# Patient Record
Sex: Male | Born: 1990 | Race: White | Hispanic: No | Marital: Single | Smoking: Current every day smoker
Health system: Southern US, Community
[De-identification: ages and names within clinical notes are randomized; demographics above are authoritative.]

## PROBLEM LIST (undated history)

## (undated) DIAGNOSIS — F419 Anxiety disorder, unspecified: Secondary | ICD-10-CM

## (undated) DIAGNOSIS — S069X9A Unspecified intracranial injury with loss of consciousness of unspecified duration, initial encounter: Secondary | ICD-10-CM

## (undated) HISTORY — PX: NO PAST SURGERIES: SHX2092

---

## 2011-06-23 ENCOUNTER — Emergency Department
Admission: EM | Admit: 2011-06-23 | Discharge: 2011-06-23 | Disposition: A | Discharge status: Home / Self Care | Payer: BC Managed Care – PPO | Source: Home / Self Care

## 2011-06-23 ENCOUNTER — Encounter: Payer: Self-pay | Admitting: *Deleted

## 2011-06-23 DIAGNOSIS — J069 Acute upper respiratory infection, unspecified: Secondary | ICD-10-CM

## 2011-06-23 DIAGNOSIS — J029 Acute pharyngitis, unspecified: Secondary | ICD-10-CM

## 2011-06-23 MED ORDER — AMOXICILLIN 875 MG PO TABS: 875.0000 mg | ORAL_TABLET | Freq: Two times a day (BID) | ORAL | Status: AC

## 2011-06-23 NOTE — ED Notes (Signed)
Pt c/o sore throat, nasal congestion and fever x 4 days. He has taken aleve, advil, dayquil and nyquil.

## 2011-06-23 NOTE — ED Provider Notes (Signed)
History     CSN: [id number]  Arrival date & time 06/23/11  1354   None     No chief complaint on file.   (Consider location/radiation/quality/duration/timing/severity/associated sxs/prior treatment) HPI Patrick Kim is a 21 y.o. male who complains of onset of cold symptoms for 7 days. He was getting worse until yesterday, but currently feeling a little better. + sore throat No cough No pleuritic pain No wheezing + nasal congestion + post-nasal drainage No sinus pain/pressure No chest congestion No itchy/red eyes No earache No hemoptysis No SOB + chills/sweats + fever No nausea No vomiting No abdominal pain No diarrhea No skin rashes + fatigue No myalgias No headache    No past medical history on file.  No past surgical history on file.  No family history on file.  History  Substance Use Topics  . Smoking status: Not on file  . Smokeless tobacco: Not on file  . Alcohol Use: Not on file      Review of Systems  Allergies  Review of patient's allergies indicates not on file.  Home Medications  No current outpatient prescriptions on file.  There were no vitals taken for this visit.  Physical Exam  Nursing note and vitals reviewed. Constitutional: He is oriented to person, place, and time. He appears well-developed and well-nourished.  HENT:  Head: Normocephalic and atraumatic.  Right Ear: Tympanic membrane, external ear and ear canal normal.  Left Ear: Tympanic membrane, external ear and ear canal normal.  Nose: Mucosal edema and rhinorrhea present.  Mouth/Throat: Posterior oropharyngeal erythema present. No oropharyngeal exudate or posterior oropharyngeal edema.  Eyes: No scleral icterus.  Neck: Neck supple.  Cardiovascular: Regular rhythm and normal heart sounds.   Pulmonary/Chest: Effort normal and breath sounds normal. No respiratory distress.  Neurological: He is alert and oriented to person, place, and time.  Skin: Skin is warm and dry.    Psychiatric: He has a normal mood and affect. His speech is normal.    ED Course  Procedures (including critical care time)   Labs Reviewed  STREP A DNA PROBE  POCT RAPID STREP A (OFFICE)   No results found.   1. Acute pharyngitis   2. Acute upper respiratory infections of unspecified site       MDM  1)  Rapid strep is negative. Throat culture is pending. Rapid mono test is negative. Hold antibiotics for a couple days since is still likely viral. 2)  Use nasal saline solution (over the counter) at least 3 times a day. 3)  Use over the counter decongestants like Zyrtec-D every 12 hours as needed to help with congestion.  If you have hypertension, do not take medicines with sudafed.  4)  Can take tylenol every 6 hours or motrin every 8 hours for pain or fever. 5)  Follow up with your primary doctor if no improvement in 5-7 days, sooner if increasing pain, fever, or new symptoms.       Lily Kocher, MD 06/23/11 [phone]

## 2011-06-25 ENCOUNTER — Telehealth: Payer: Self-pay | Admitting: Emergency Medicine

## 2012-06-11 ENCOUNTER — Encounter: Payer: Self-pay | Admitting: Emergency Medicine

## 2012-06-11 ENCOUNTER — Emergency Department
Admission: EM | Admit: 2012-06-11 | Discharge: 2012-06-11 | Disposition: A | Discharge status: Home / Self Care | Payer: BC Managed Care – PPO | Source: Home / Self Care

## 2012-06-11 DIAGNOSIS — H109 Unspecified conjunctivitis: Secondary | ICD-10-CM

## 2012-06-11 MED ORDER — POLYMYXIN B-TRIMETHOPRIM 10000-0.1 UNIT/ML-% OP SOLN: 1.0000 [drp] | OPHTHALMIC | Status: DC

## 2012-06-11 NOTE — ED Provider Notes (Signed)
History     CSN: [id number]  Arrival date & time 06/11/12  1010   None     Chief Complaint  Patient presents with  . Eye Drainage  . Conjunctivitis    (Consider location/radiation/quality/duration/timing/severity/associated sxs/prior treatment) Patient is a 22 y.o. male presenting with conjunctivitis.  Conjunctivitis  The current episode started yesterday. The problem occurs rarely. The problem has been gradually worsening. The problem is mild. Nothing relieves the symptoms. Associated symptoms include eye itching, ear discharge, sore throat and eye redness. Pertinent negatives include no fever, no decreased vision, no photophobia, no congestion, no ear pain, no headaches, no hearing loss, no mouth sores, no rhinorrhea, no cough and no URI. Severity: none. The left (has noticed start of similar sxs in R eye since this am ) eye is affected.   History reviewed. No pertinent past medical history.  History reviewed. No pertinent past surgical history.  History reviewed. No pertinent family history.  History  Substance Use Topics  . Smoking status: Never Smoker   . Smokeless tobacco: Not on file  . Alcohol Use: No      Review of Systems  Constitutional: Negative for fever.  HENT: Positive for sore throat and ear discharge. Negative for hearing loss, ear pain, congestion, rhinorrhea and mouth sores.   Eyes: Positive for redness and itching. Negative for photophobia.  Respiratory: Negative for cough.   Neurological: Negative for headaches.  All other systems reviewed and are negative.    Allergies  Review of patient's allergies indicates no known allergies.  Home Medications  No current outpatient prescriptions on file.  BP 102/64  Pulse 72  Temp 98.5 F (36.9 C)  Resp 16  Ht 6' (1.829 m)  Wt 145 lb (65.772 kg)  BMI 19.67 kg/m2  SpO2 99%  Physical Exam  Constitutional: He appears well-developed and well-nourished.  HENT:  Head: Normocephalic and atraumatic.    Right Ear: External ear normal.  Left Ear: External ear normal.  Eyes: EOM are normal. Pupils are equal, round, and reactive to light.         L eye conjunctivitis No perilimbic involvement  EMOI No corneal abrasions on flourescein eye exam      ED Course  Procedures (including critical care time)  Labs Reviewed - No data to display No results found.   1. Conjunctivitis       MDM  Will place on polytrim for bacterial coverage. Likely viral in etiology.  No corneal abrasion on flourescein eye exam Discussed infectious and ophtho red flags at length.  Otherwise, follow up as needed.     The patient and/or caregiver has been counseled thoroughly with regard to treatment plan and/or medications prescribed including dosage, schedule, interactions, rationale for use, and possible side effects and they verbalize understanding. Diagnoses and expected course of recovery discussed and will return if not improved as expected or if the condition worsens. Patient and/or caregiver verbalized understanding.             Doree Albee, MD 06/11/12 1118

## 2012-06-11 NOTE — ED Notes (Signed)
Reports 24 hr hx of progressive left eye conjunctivitis with sticky discharge this a.m.

## 2013-03-07 ENCOUNTER — Emergency Department (HOSPITAL_COMMUNITY)
Admission: EM | Admit: 2013-03-07 | Discharge: 2013-03-08 | Disposition: A | Discharge status: Home / Self Care | Payer: BC Managed Care – PPO | Attending: Emergency Medicine | Admitting: Emergency Medicine

## 2013-03-07 ENCOUNTER — Emergency Department (HOSPITAL_COMMUNITY): Payer: BC Managed Care – PPO

## 2013-03-07 ENCOUNTER — Encounter (HOSPITAL_COMMUNITY): Payer: Self-pay | Admitting: Emergency Medicine

## 2013-03-07 DIAGNOSIS — Y9389 Activity, other specified: Secondary | ICD-10-CM | POA: Insufficient documentation

## 2013-03-07 DIAGNOSIS — S62501A Fracture of unspecified phalanx of right thumb, initial encounter for closed fracture: Secondary | ICD-10-CM

## 2013-03-07 DIAGNOSIS — S62319A Displaced fracture of base of unspecified metacarpal bone, initial encounter for closed fracture: Secondary | ICD-10-CM | POA: Insufficient documentation

## 2013-03-07 DIAGNOSIS — Y92410 Unspecified street and highway as the place of occurrence of the external cause: Secondary | ICD-10-CM | POA: Insufficient documentation

## 2013-03-07 DIAGNOSIS — Z79899 Other long term (current) drug therapy: Secondary | ICD-10-CM | POA: Insufficient documentation

## 2013-03-07 MED ORDER — OXYCODONE-ACETAMINOPHEN 5-325 MG PO TABS
1.0000 | ORAL_TABLET | Freq: Once | ORAL | Status: AC
  Administered 2013-03-07: 1 via ORAL
  Filled 2013-03-07: qty 1

## 2013-03-07 MED ORDER — OXYCODONE-ACETAMINOPHEN 5-325 MG PO TABS: 1.0000 | ORAL_TABLET | ORAL | Status: AC | PRN

## 2013-03-07 MED ORDER — IBUPROFEN 200 MG PO TABS
600.0000 mg | ORAL_TABLET | Freq: Once | ORAL | Status: AC
  Administered 2013-03-07: 600 mg via ORAL
  Filled 2013-03-07: qty 3
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
## 2013-03-07 NOTE — ED Notes (Signed)
Ortho contacted regarding splint
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
## 2013-03-14 ENCOUNTER — Encounter (HOSPITAL_COMMUNITY): Payer: Self-pay | Admitting: Pharmacy Technician

## 2013-03-15 ENCOUNTER — Other Ambulatory Visit: Payer: Self-pay | Admitting: Orthopedic Surgery

## 2013-03-15 ENCOUNTER — Encounter (HOSPITAL_COMMUNITY): Payer: Self-pay | Admitting: *Deleted

## 2013-03-16 ENCOUNTER — Ambulatory Visit (HOSPITAL_COMMUNITY)
Admission: RE | Admit: 2013-03-16 | Discharge: 2013-03-16 | Disposition: A | Discharge status: Home / Self Care | Payer: BC Managed Care – PPO | Source: Ambulatory Visit | Attending: Orthopedic Surgery | Admitting: Orthopedic Surgery

## 2013-03-16 ENCOUNTER — Ambulatory Visit (HOSPITAL_COMMUNITY): Payer: BC Managed Care – PPO | Admitting: Anesthesiology

## 2013-03-16 ENCOUNTER — Encounter (HOSPITAL_COMMUNITY)
Admission: RE | Disposition: A | Discharge status: Home / Self Care | Payer: Self-pay | Source: Ambulatory Visit | Attending: Orthopedic Surgery

## 2013-03-16 ENCOUNTER — Encounter (HOSPITAL_COMMUNITY): Payer: Self-pay | Admitting: *Deleted

## 2013-03-16 ENCOUNTER — Encounter (HOSPITAL_COMMUNITY): Payer: BC Managed Care – PPO | Admitting: Anesthesiology

## 2013-03-16 DIAGNOSIS — S5420XA Injury of radial nerve at forearm level, unspecified arm, initial encounter: Secondary | ICD-10-CM | POA: Insufficient documentation

## 2013-03-16 DIAGNOSIS — S62233A Other displaced fracture of base of first metacarpal bone, unspecified hand, initial encounter for closed fracture: Secondary | ICD-10-CM | POA: Insufficient documentation

## 2013-03-16 DIAGNOSIS — Y92410 Unspecified street and highway as the place of occurrence of the external cause: Secondary | ICD-10-CM | POA: Insufficient documentation

## 2013-03-16 DIAGNOSIS — S62501A Fracture of unspecified phalanx of right thumb, initial encounter for closed fracture: Secondary | ICD-10-CM

## 2013-03-16 DIAGNOSIS — Y998 Other external cause status: Secondary | ICD-10-CM | POA: Insufficient documentation

## 2013-03-16 HISTORY — PX: ORIF WRIST FRACTURE: SHX2133

## 2013-03-16 HISTORY — DX: Unspecified intracranial injury with loss of consciousness of unspecified duration, initial encounter: S06.9X9A

## 2013-03-16 HISTORY — DX: Anxiety disorder, unspecified: F41.9

## 2013-03-16 LAB — COMPREHENSIVE METABOLIC PANEL
ALT: 25 U/L (ref 0–53)
AST: 19 U/L (ref 0–37)
Albumin: 3.8 g/dL (ref 3.5–5.2)
BUN: 22 mg/dL (ref 6–23)
CO2: 26 mEq/L (ref 19–32)
Calcium: 9.3 mg/dL (ref 8.4–10.5)
Chloride: 103 mEq/L (ref 96–112)
Creatinine, Ser: 1.1 mg/dL (ref 0.50–1.35)
GFR calc Af Amer: >90 mL/min (ref >90)
Total Bilirubin: 0.2 mg/dL — ABNORMAL LOW (ref 0.3–1.2)
Total Protein: 7.3 g/dL (ref 6.0–8.3)

## 2013-03-16 LAB — CBC
HCT: 42.3 % (ref 39.0–52.0)
MCH: 31 pg (ref 26.0–34.0)
MCHC: 35.2 g/dL (ref 30.0–36.0)
MCV: 87.9 fL (ref 78.0–100.0)
Platelets: 256 10*3/uL (ref 150–400)
RBC: 4.81 MIL/uL (ref 4.22–5.81)
RDW: 13.4 % (ref 11.5–15.5)

## 2013-03-16 SURGERY — OPEN REDUCTION INTERNAL FIXATION (ORIF) WRIST FRACTURE
Anesthesia: General | Site: Thumb | Laterality: Right | Wound class: Clean

## 2013-03-16 MED ORDER — BACITRACIN-NEOMYCIN-POLYMYXIN 400-5-5000 EX OINT
TOPICAL_OINTMENT | CUTANEOUS | Status: AC
  Filled 2013-03-16: qty 1

## 2013-03-16 MED ORDER — PROPOFOL 10 MG/ML IV BOLUS
INTRAVENOUS | Status: DC | PRN
  Administered 2013-03-16: 200 mg via INTRAVENOUS

## 2013-03-16 MED ORDER — HYDROMORPHONE HCL PF 1 MG/ML IJ SOLN
0.2500 mg | INTRAMUSCULAR | Status: DC | PRN
  Administered 2013-03-16 (×2): 0.5 mg via INTRAVENOUS

## 2013-03-16 MED ORDER — BACITRACIN-NEOMYCIN-POLYMYXIN OINTMENT TUBE
TOPICAL_OINTMENT | CUTANEOUS | Status: DC | PRN
  Administered 2013-03-16: 1 via TOPICAL

## 2013-03-16 MED ORDER — OXYCODONE HCL 5 MG PO TABS
5.0000 mg | ORAL_TABLET | Freq: Once | ORAL | Status: AC | PRN
  Administered 2013-03-16: 5 mg via ORAL

## 2013-03-16 MED ORDER — 0.9 % SODIUM CHLORIDE (POUR BTL) OPTIME
TOPICAL | Status: DC | PRN
  Administered 2013-03-16: 1000 mL

## 2013-03-16 MED ORDER — ONDANSETRON HCL 4 MG/2ML IJ SOLN
INTRAMUSCULAR | Status: DC | PRN
  Administered 2013-03-16: 4 mg via INTRAMUSCULAR

## 2013-03-16 MED ORDER — LACTATED RINGERS IV SOLN
INTRAVENOUS | Status: DC | PRN
  Administered 2013-03-16: 10:00:00 via INTRAVENOUS

## 2013-03-16 MED ORDER — CEFAZOLIN SODIUM-DEXTROSE 2-3 GM-% IV SOLR
INTRAVENOUS | Status: AC
  Administered 2013-03-16: 2 g via INTRAVENOUS
  Filled 2013-03-16: qty 50

## 2013-03-16 MED ORDER — FENTANYL CITRATE 0.05 MG/ML IJ SOLN
INTRAMUSCULAR | Status: DC | PRN
  Administered 2013-03-16: 150 ug via INTRAVENOUS
  Administered 2013-03-16 (×2): 50 ug via INTRAVENOUS

## 2013-03-16 MED ORDER — BUPIVACAINE HCL (PF) 0.25 % IJ SOLN
INTRAMUSCULAR | Status: DC | PRN
  Administered 2013-03-16: 10 mL

## 2013-03-16 MED ORDER — MIDAZOLAM HCL 5 MG/5ML IJ SOLN
INTRAMUSCULAR | Status: DC | PRN
  Administered 2013-03-16 (×2): 1 mg via INTRAVENOUS

## 2013-03-16 MED ORDER — CEFAZOLIN SODIUM-DEXTROSE 2-3 GM-% IV SOLR: 2.0000 g | INTRAVENOUS | Status: DC

## 2013-03-16 MED ORDER — LACTATED RINGERS IV SOLN
INTRAVENOUS | Status: DC
  Administered 2013-03-16: 08:00:00 via INTRAVENOUS

## 2013-03-16 MED ORDER — SODIUM CHLORIDE 0.45 % IV SOLN: INTRAVENOUS | Status: DC

## 2013-03-16 MED ORDER — BUPIVACAINE HCL (PF) 0.25 % IJ SOLN
INTRAMUSCULAR | Status: AC
  Filled 2013-03-16: qty 30

## 2013-03-16 MED ORDER — BACITRACIN-NEOMYCIN-POLYMYXIN OINTMENT TUBE
TOPICAL_OINTMENT | Freq: Once | CUTANEOUS | Status: DC
  Filled 2013-03-16: qty 15

## 2013-03-16 MED ORDER — CHLORHEXIDINE GLUCONATE 4 % EX LIQD: 60.0000 mL | Freq: Once | CUTANEOUS | Status: DC

## 2013-03-16 MED ORDER — LIDOCAINE HCL (CARDIAC) 20 MG/ML IV SOLN
INTRAVENOUS | Status: DC | PRN
  Administered 2013-03-16: 50 mg via INTRAVENOUS

## 2013-03-16 MED ORDER — HYDROMORPHONE HCL PF 1 MG/ML IJ SOLN
INTRAMUSCULAR | Status: AC
  Administered 2013-03-16: 0.5 mg via INTRAVENOUS
  Filled 2013-03-16: qty 1

## 2013-03-16 MED ORDER — OXYCODONE HCL 5 MG/5ML PO SOLN: 5.0000 mg | Freq: Once | ORAL | Status: AC | PRN

## 2013-03-16 MED ORDER — CEPHALEXIN 500 MG PO CAPS: 500.0000 mg | ORAL_CAPSULE | Freq: Four times a day (QID) | ORAL | Status: AC

## 2013-03-16 MED ORDER — OXYCODONE HCL 5 MG PO TABS
ORAL_TABLET | ORAL | Status: AC
  Administered 2013-03-16: 5 mg via ORAL
  Filled 2013-03-16: qty 1

## 2013-03-16 SURGICAL SUPPLY — 53 items
BANDAGE ELASTIC 3 VELCRO ST LF (GAUZE/BANDAGES/DRESSINGS) IMPLANT
BANDAGE ELASTIC 4 VELCRO ST LF (GAUZE/BANDAGES/DRESSINGS) ×2 IMPLANT
BANDAGE GAUZE 4  KLING STR (GAUZE/BANDAGES/DRESSINGS) ×2 IMPLANT
BANDAGE GAUZE ELAST BULKY 4 IN (GAUZE/BANDAGES/DRESSINGS) IMPLANT
BLADE SURG ROTATE 9660 (MISCELLANEOUS) IMPLANT
BNDG ESMARK 4X9 LF (GAUZE/BANDAGES/DRESSINGS) ×2 IMPLANT
CLOTH BEACON ORANGE TIMEOUT ST (SAFETY) IMPLANT
CORDS BIPOLAR (ELECTRODE) ×2 IMPLANT
COVER SURGICAL LIGHT HANDLE (MISCELLANEOUS) ×2 IMPLANT
CUFF TOURNIQUET SINGLE 18IN (TOURNIQUET CUFF) ×2 IMPLANT
CUFF TOURNIQUET SINGLE 24IN (TOURNIQUET CUFF) IMPLANT
DRAIN TLS ROUND 10FR (DRAIN) IMPLANT
DRAPE OEC MINIVIEW 54X84 (DRAPES) ×2 IMPLANT
DRAPE SURG 17X23 STRL (DRAPES) ×2 IMPLANT
DRSG EMULSION OIL 3X3 NADH (GAUZE/BANDAGES/DRESSINGS) ×2 IMPLANT
GAUZE XEROFORM 1X8 LF (GAUZE/BANDAGES/DRESSINGS) ×2 IMPLANT
GLOVE BIOGEL M STRL SZ7.5 (GLOVE) ×2 IMPLANT
GLOVE SS BIOGEL STRL SZ 8 (GLOVE) ×1 IMPLANT
GLOVE SUPERSENSE BIOGEL SZ 8 (GLOVE) ×1
GOWN PREVENTION PLUS XLARGE (GOWN DISPOSABLE) ×2 IMPLANT
GOWN STRL NON-REIN LRG LVL3 (GOWN DISPOSABLE) ×6 IMPLANT
GOWN STRL REIN XL XLG (GOWN DISPOSABLE) ×4 IMPLANT
KIT BASIN OR (CUSTOM PROCEDURE TRAY) ×2 IMPLANT
KIT ROOM TURNOVER OR (KITS) ×2 IMPLANT
LOOP VESSEL MAXI BLUE (MISCELLANEOUS) IMPLANT
MANIFOLD NEPTUNE II (INSTRUMENTS) IMPLANT
NEEDLE 22X1 1/2 (OR ONLY) (NEEDLE) ×2 IMPLANT
NS IRRIG 1000ML POUR BTL (IV SOLUTION) ×2 IMPLANT
PACK ORTHO EXTREMITY (CUSTOM PROCEDURE TRAY) ×2 IMPLANT
PAD ARMBOARD 7.5X6 YLW CONV (MISCELLANEOUS) ×4 IMPLANT
PAD CAST 3X4 CTTN HI CHSV (CAST SUPPLIES) ×1 IMPLANT
PAD CAST 4YDX4 CTTN HI CHSV (CAST SUPPLIES) ×1 IMPLANT
PADDING CAST ABS 4INX4YD NS (CAST SUPPLIES) ×1
PADDING CAST ABS COTTON 4X4 ST (CAST SUPPLIES) ×1 IMPLANT
PADDING CAST COTTON 3X4 STRL (CAST SUPPLIES) ×1
PADDING CAST COTTON 4X4 STRL (CAST SUPPLIES) ×1
PLATE LOCKING 2.5 STRAIGHT (Plate) ×2 IMPLANT
SCREW PEG 13MM (Screw) ×2 IMPLANT
SCREW PEG 2.5X22 NONLOCK (Screw) ×2 IMPLANT
SCREW PEG LOCK 2.5X15 (Orthopedic Implant) ×2 IMPLANT
SPLINT FIBERGLASS 4X30 (CAST SUPPLIES) ×2 IMPLANT
SPONGE GAUZE 4X4 12PLY (GAUZE/BANDAGES/DRESSINGS) ×2 IMPLANT
SPONGE LAP 4X18 X RAY DECT (DISPOSABLE) ×2 IMPLANT
SUT MNCRL AB 4-0 PS2 18 (SUTURE) ×2 IMPLANT
SUT PROLENE 3 0 PS 2 (SUTURE) ×2 IMPLANT
SUT VIC AB 3-0 FS2 27 (SUTURE) ×2 IMPLANT
SYR CONTROL 10ML LL (SYRINGE) ×2 IMPLANT
SYSTEM CHEST DRAIN TLS 7FR (DRAIN) IMPLANT
TOWEL OR 17X24 6PK STRL BLUE (TOWEL DISPOSABLE) ×2 IMPLANT
TOWEL OR 17X26 10 PK STRL BLUE (TOWEL DISPOSABLE) ×2 IMPLANT
TUBE CONNECTING 12X1/4 (SUCTIONS) ×2 IMPLANT
TUBE EVACUATION TLS (MISCELLANEOUS) ×2 IMPLANT
WATER STERILE IRR 1000ML POUR (IV SOLUTION) ×2 IMPLANT

## 2013-03-16 NOTE — Op Note (Signed)
See Dictation #[id number] Dominica Severin MD

## 2013-03-16 NOTE — Anesthesia Postprocedure Evaluation (Signed)
  Anesthesia Post-op Note  Patient: Patrick Kim  Procedure(s) Performed: Procedure(s): OPEN REDUCTION INTERNAL FIXATION (ORIF) RIGHT THUMB 1ST METACARPAL  (Right)  Patient Location: PACU  Anesthesia Type:General  Level of Consciousness: awake, alert  and oriented  Airway and Oxygen Therapy: Patient Spontanous Breathing  Post-op Pain: mild  Post-op Assessment: Post-op Vital signs reviewed, Patient's Cardiovascular Status Stable, Respiratory Function Stable, Patent Airway and No signs of Nausea or vomiting  Post-op Vital Signs: Reviewed and stable  Complications: No apparent anesthesia complications

## 2013-03-16 NOTE — Transfer of Care (Signed)
Immediate Anesthesia Transfer of Care Note  Patient: Patrick Kim  Procedure(s) Performed: Procedure(s): OPEN REDUCTION INTERNAL FIXATION (ORIF) RIGHT THUMB 1ST METACARPAL  (Right)  Patient Location: PACU  Anesthesia Type:General  Level of Consciousness: awake, alert , sedated and patient cooperative  Airway & Oxygen Therapy: Patient Spontanous Breathing and Patient connected to nasal cannula oxygen  Post-op Assessment: Report given to PACU RN, Post -op Vital signs reviewed and stable and Patient moving all extremities  Post vital signs: Reviewed and stable  Complications: No apparent anesthesia complications

## 2013-03-16 NOTE — Anesthesia Procedure Notes (Signed)
Procedure Name: LMA Insertion Date/Time: 03/16/2013 10:32 AM Performed by: Coralee Rud Pre-anesthesia Checklist: Patient identified, Emergency Drugs available, Suction available and Patient being monitored Patient Re-evaluated:Patient Re-evaluated prior to inductionOxygen Delivery Method: Circle system utilized Preoxygenation: Pre-oxygenation with 100% oxygen Intubation Type: IV induction Ventilation: Mask ventilation without difficulty LMA: LMA inserted LMA Size: 5.0 Number of attempts: 1 Placement Confirmation: positive ETCO2 Tube secured with: Tape

## 2013-03-16 NOTE — H&P (Signed)
Patrick Kim is an 22 y.o. male.   Chief Complaint: right thumb fracture HPI: Marland KitchenMarland KitchenPatient presents for evaluation and treatment of the of their upper extremity predicament. The patient denies neck back chest or of abdominal pain. The patient notes that they have no lower extremity problems. The patient from primarily complains of the upper extremity pain noted.  Past Medical History  Diagnosis Date  . Anxiety   . Head injury, acute, with loss of consciousness     age [age] fell    Past Surgical History  Procedure Laterality Date  . No past surgeries      History reviewed. No pertinent family history. Social History:  reports that he has been smoking.  He does not have any smokeless tobacco history on file. He reports that he drinks alcohol. He reports that he uses illicit drugs (Marijuana).  Allergies: No Known Allergies  Medications Prior to Admission  Medication Sig Dispense Refill  . ibuprofen (ADVIL,MOTRIN) 200 MG tablet Take 200 mg by mouth every 6 (six) hours as needed for pain (sore throat).      Marland Kitchen lisdexamfetamine (VYVANSE) 20 MG capsule Take 20 mg by mouth daily.      Marland Kitchen OVER THE COUNTER MEDICATION Take 1 mL by mouth daily as needed (for vitamin). Clenbuterol fat burning supplement      . oxyCODONE-acetaminophen (PERCOCET/ROXICET) 5-325 MG per tablet Take 1 tablet by mouth every 4 (four) hours as needed for pain.  15 tablet  0  . Tetrahydrozoline HCl (VISINE OP) Apply 1 drop to eye every other day as needed (for dry eyes).        Results for orders placed during the hospital encounter of 03/16/13 (from the past 48 hour(s))  CBC     Status: Abnormal   Collection Time    03/16/13  7:53 AM      Result Value Range   WBC 12.8 (*) 4.0 - 10.5 K/uL   RBC 4.81  4.22 - 5.81 MIL/uL   Hemoglobin 14.9  13.0 - 17.0 g/dL   HCT 16.1  09.6 - 04.5 %   MCV 87.9  78.0 - 100.0 fL   MCH 31.0  26.0 - 34.0 pg   MCHC 35.2  30.0 - 36.0 g/dL   RDW 40.9  81.1 - 91.4 %   Platelets 256  150 - 400  K/uL  COMPREHENSIVE METABOLIC PANEL     Status: Abnormal   Collection Time    03/16/13  7:53 AM      Result Value Range   Sodium 139  135 - 145 mEq/L   Potassium 4.2  3.5 - 5.1 mEq/L   Chloride 103  96 - 112 mEq/L   CO2 26  19 - 32 mEq/L   Glucose, Bld 95  70 - 99 mg/dL   BUN 22  6 - 23 mg/dL   Creatinine, Ser 7.82  0.50 - 1.35 mg/dL   Calcium 9.3  8.4 - 95.6 mg/dL   Total Protein 7.3  6.0 - 8.3 g/dL   Albumin 3.8  3.5 - 5.2 g/dL   AST 19  0 - 37 U/L   ALT 25  0 - 53 U/L   Alkaline Phosphatase 73  39 - 117 U/L   Total Bilirubin 0.2 (*) 0.3 - 1.2 mg/dL   GFR calc non Af Amer >90  >90 mL/min   GFR calc Af Amer >90  >90 mL/min   Comment: (NOTE)     The eGFR has been calculated using the  CKD EPI equation.     This calculation has not been validated in all clinical situations.     eGFR's persistently <90 mL/min signify possible Chronic Kidney     Disease.   No results found.  Review of Systems  HENT: Negative.   Eyes: Negative.   Cardiovascular: Negative.   Gastrointestinal: Negative.   Genitourinary: Negative.   Neurological: Negative.   Endo/Heme/Allergies: Negative.     Blood pressure 122/56, pulse 62, temperature 97.3 F (36.3 C), temperature source Oral, resp. rate 18, height 5\' 11"  (1.803 m), weight 75.297 kg (166 lb), SpO2 99.00%. Physical Exam ..The patient is alert and oriented in no acute distress the patient complains of pain in the affected upper extremity.  The patient is noted to have a normal HEENT exam.  Lung fields show equal chest expansion and no shortness of breath  abdomen exam is nontender without distention.  Lower extremity examination does not show any fracture dislocation or blood clot symptoms.  Pelvis is stable neck and back are stable and nontender  Right thumb fx closed with displacement  Assessment/Plan .Marland KitchenWe are planning surgery for your upper extremity. The risk and benefits of surgery include risk of bleeding infection anesthesia damage  to normal structures and failure of the surgery to accomplish its intended goals of relieving symptoms and restoring function with this in mind we'll going to proceed. I have specifically discussed with the patient the pre-and postoperative regime and the does and don'ts and risk and benefits in great detail. Risk and benefits of surgery also include risk of dystrophy chronic nerve pain failure of the healing process to go onto completion and other inherent risks of surgery The relavent the pathophysiology of the disease/injury process, as well as the alternatives for treatment and postoperative course of action has been discussed in great detail with the patient who desires to proceed.  We will do everything in our power to help you (the patient) restore function to the upper extremity. Is a pleasure to see this patient today.  Karen Chafe 03/16/2013, 9:44 AM

## 2013-03-16 NOTE — Anesthesia Preprocedure Evaluation (Addendum)
Anesthesia Evaluation  Patient identified by MRN, date of birth, ID band Patient awake    Reviewed: Allergy & Precautions, H&P , NPO status , Patient's Chart, lab work & pertinent test results  Airway Mallampati: I TM Distance: >3 FB Neck ROM: Full    Dental no notable dental hx. (+) Teeth Intact and Dental Advisory Given   Pulmonary Current Smoker,  breath sounds clear to auscultation  Pulmonary exam normal       Cardiovascular negative cardio ROS  Rhythm:Regular Rate:Normal     Neuro/Psych negative neurological ROS  negative psych ROS   GI/Hepatic negative GI ROS, Neg liver ROS,   Endo/Other  negative endocrine ROS  Renal/GU negative Renal ROS  negative genitourinary   Musculoskeletal   Abdominal   Peds  (+) ATTENTION DEFICIT DISORDER WITHOUT HYPERACTIVITY Hematology negative hematology ROS (+)   Anesthesia Other Findings   Reproductive/Obstetrics negative OB ROS                           Anesthesia Physical Anesthesia Plan  ASA: II  Anesthesia Plan: General   Post-op Pain Management:    Induction: Intravenous  Airway Management Planned: LMA  Additional Equipment:   Intra-op Plan:   Post-operative Plan: Extubation in OR  Informed Consent: I have reviewed the patients History and Physical, chart, labs and discussed the procedure including the risks, benefits and alternatives for the proposed anesthesia with the patient or authorized representative who has indicated his/her understanding and acceptance.   Dental advisory given  Plan Discussed with: CRNA  Anesthesia Plan Comments: (Pt. Declines block.)       Anesthesia Quick Evaluation

## 2013-03-17 NOTE — Op Note (Signed)
NAMEMarland Kitchen  Patrick Kim, Patrick NO.:  [id number]  MEDICAL RECORD NO.:  [id number]  LOCATION:  [facility]                         FACILITY:  [facility]  PHYSICIAN:  Patrick Kim, M.D.DATE OF BIRTH:  09/24/1990  DATE OF PROCEDURE: DATE OF DISCHARGE:  03/16/2013                              OPERATIVE REPORT   PREOPERATIVE DIAGNOSIS:  Complex first metacarpal fracture, right thumb, with severe displacement.  POSTOPERATIVE DIAGNOSIS:  Complex first metacarpal fracture, right thumb, with severe displacement.  PROCEDURE: 1. Extensive superficial radial nerve neurolysis and dissection     secondary to contusive injury and hematoma, right thumb. 2. Open reduction and internal fixation first metacarpal right thumb     with a Biomet ALPS 2.5 plate system. 3. Stress radiography.  SURGEON:  Patrick Ano. Amanda Pea, M.D.  ASSISTANT:  Karie Chimera, PA-C.  COMPLICATION:  None.  ANESTHESIA:  General.  TOURNIQUET TIME:  Less than an hour.  INDICATIONS:  This pleasant man presents with a displaced fracture.  He has a large amount of hematoma and deformity.  I have discussed with him the risks and benefits of surgery, and he desires to proceed.  OPERATION IN DETAIL:  The patient was seen by myself and Anesthesia, taken to the operative suite, underwent smooth induction of general anesthesia.  Time-out called.  Pre and postop check was complete. Outline marks were made.  Following this, he was very sterilely secured. Arm was elevated, and tourniquet was insufflated to 250 mmHg.  Time-out and pre and postop check list were secured.  A Wagner approach was made to the thumb and thenar region.  The patient tolerated this well.  I very carefully identified the superficial radial nerve proximally and performed a neurolysis and removal of bloody hematoma around the nerve. It was carefully swept out of harm's way and look at and kept in view at all times, to make sure that no that there were no  problems that arose.  The patient tolerated this quite well.  Following this, I then performed a takedown of the thenar musculature to access the fracture.  The fracture was accessed.  Following this, we performed I and D and curettage of the area.  Once this done, under live fluoroscopy, I then reduced the fracture and applied a 2.5 Biomet plate. Three screws were placed.  The most distal and most proximal were nonlocking, and the middle screw was locking.  I placed the screws in order of the most proximal first, which engaged the fragment.  This was a home run type screw to allow for perfect positioning of the fracture construct.  After this was done, I applied the most distal screw.  These were nonlocking screws.  The final central screw was a locking screw. The clamp was removed.  He was stress tested and had excellent stability and no complicating features.  We irrigated copiously, closed the fascia, protected the superficial radial nerve, and then closed skin edge.  AP lateral and oblique x-rays were taken.  Stress radiography under live fluoroscopy was taken as well to make sure that he had excellent stability and this was confirmed.  I was pleased with my findings.  The wound was  stable.  We dressed him with Adaptic Xeroform and a Neosporin over some abrasions, which were outside of the wound. Following this, he was placed in a thumb spica splint.  I will see him back in the office in 10-12 days.  Sutures to be removed. X-rays were taken including the hyperpronated view, and then we will go ahead and plan for casting.  At 4 weeks, we will begin some gentle interval range of motion predicated on x-ray healing.  I have discussed with him and his family all issues.  He is a very active young man, but nevertheless I tried my best to try and give the family measures to curtail his heavy duty activity, etc. They understand this, do's and don'ts, etc..  He will be discharged  home on Keflex.  He will continue his pain management, vitamin C, Peri- Colace, and other measures according to our protocol.  It was an absolute pleasure to see him today and participate in his care. Hopefully, he will have an uneventful recovery.  There were no complications with this surgery, which went quite smoothly.     Patrick Ano. Amanda Pea, M.D.     [facility]  D:  03/16/2013  T:  03/17/2013  Job:  [id number]

## 2013-03-20 ENCOUNTER — Encounter (HOSPITAL_COMMUNITY): Payer: Self-pay | Admitting: Orthopedic Surgery
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
